# Patient Record
Sex: Male | Born: 1980 | Race: White | Hispanic: No | Smoking: Current every day smoker
Health system: Southern US, Community
[De-identification: ages and names within clinical notes are randomized; demographics above are authoritative.]

## PROBLEM LIST (undated history)

## (undated) HISTORY — PX: EYE SURGERY: SHX253

---
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
## 2001-12-18 ENCOUNTER — Emergency Department (HOSPITAL_COMMUNITY): Admission: EM | Admit: 2001-12-18 | Discharge: 2001-12-18 | Payer: Self-pay | Admitting: Emergency Medicine

## 2002-08-06 ENCOUNTER — Emergency Department (HOSPITAL_COMMUNITY): Admission: EM | Admit: 2002-08-06 | Discharge: 2002-08-06 | Payer: Self-pay | Admitting: Emergency Medicine

## 2002-09-30 ENCOUNTER — Emergency Department (HOSPITAL_COMMUNITY): Admission: EM | Admit: 2002-09-30 | Discharge: 2002-09-30 | Payer: Self-pay | Admitting: Emergency Medicine

## 2003-01-27 ENCOUNTER — Emergency Department (HOSPITAL_COMMUNITY): Admission: EM | Admit: 2003-01-27 | Discharge: 2003-01-27 | Payer: Self-pay | Admitting: Emergency Medicine

## 2005-12-18 ENCOUNTER — Emergency Department (HOSPITAL_COMMUNITY): Admission: EM | Admit: 2005-12-18 | Discharge: 2005-12-18 | Payer: Self-pay | Admitting: Emergency Medicine

## 2009-05-23 ENCOUNTER — Emergency Department (HOSPITAL_COMMUNITY): Admission: EM | Admit: 2009-05-23 | Discharge: 2009-05-23 | Payer: Self-pay | Admitting: Emergency Medicine

## 2010-07-17 LAB — COMPREHENSIVE METABOLIC PANEL
ALT: 17 U/L (ref 0–53)
AST: 19 U/L (ref 0–37)
Albumin: 4.2 g/dL (ref 3.5–5.2)
Alkaline Phosphatase: 43 U/L (ref 39–117)
BUN: 15 mg/dL (ref 6–23)
CO2: 27 mEq/L (ref 19–32)
Calcium: 8.8 mg/dL (ref 8.4–10.5)
Chloride: 104 mEq/L (ref 96–112)
Creatinine, Ser: 1.05 mg/dL (ref 0.4–1.5)
GFR calc Af Amer: >60 mL/min (ref >60)
GFR calc non Af Amer: >60 mL/min (ref >60)
Glucose, Bld: 112 mg/dL — ABNORMAL HIGH (ref 70–99)
Potassium: 4.6 mEq/L (ref 3.5–5.1)
Sodium: 137 mEq/L (ref 135–145)
Total Bilirubin: 0.4 mg/dL (ref 0.3–1.2)
Total Protein: 6.9 g/dL (ref 6.0–8.3)

## 2010-07-17 LAB — DIFFERENTIAL
Basophils Relative: 0 % (ref 0–1)
Eosinophils Absolute: 0.1 10*3/uL (ref 0.0–0.7)
Eosinophils Relative: 2 % (ref 0–5)
Lymphs Abs: 1.7 10*3/uL (ref 0.7–4.0)
Monocytes Relative: 15 % — ABNORMAL HIGH (ref 3–12)

## 2010-07-17 LAB — CBC
HCT: 48.4 % (ref 39.0–52.0)
Hemoglobin: 16.7 g/dL (ref 13.0–17.0)
MCHC: 34.6 g/dL (ref 30.0–36.0)
MCV: 92 fL (ref 78.0–100.0)
Platelets: 141 10*3/uL — ABNORMAL LOW (ref 150–400)
RBC: 5.27 MIL/uL (ref 4.22–5.81)
RDW: 13.3 % (ref 11.5–15.5)
WBC: 5.8 10*3/uL (ref 4.0–10.5)

## 2010-07-17 LAB — HEMOCCULT GUIAC POC 1CARD (OFFICE): Fecal Occult Bld: POSITIVE

## 2010-08-25 ENCOUNTER — Emergency Department (HOSPITAL_COMMUNITY)
Admission: EM | Admit: 2010-08-25 | Discharge: 2010-08-26 | Disposition: A | Discharge status: Home / Self Care | Payer: Self-pay | Attending: Emergency Medicine | Admitting: Emergency Medicine

## 2010-08-25 ENCOUNTER — Emergency Department (HOSPITAL_COMMUNITY): Payer: Self-pay

## 2010-08-25 DIAGNOSIS — Y92009 Unspecified place in unspecified non-institutional (private) residence as the place of occurrence of the external cause: Secondary | ICD-10-CM | POA: Insufficient documentation

## 2010-08-25 DIAGNOSIS — IMO0002 Reserved for concepts with insufficient information to code with codable children: Secondary | ICD-10-CM | POA: Insufficient documentation

## 2010-08-25 DIAGNOSIS — S63509A Unspecified sprain of unspecified wrist, initial encounter: Secondary | ICD-10-CM | POA: Insufficient documentation

## 2010-08-25 DIAGNOSIS — M79609 Pain in unspecified limb: Secondary | ICD-10-CM | POA: Insufficient documentation

## 2010-08-25 DIAGNOSIS — F17200 Nicotine dependence, unspecified, uncomplicated: Secondary | ICD-10-CM | POA: Insufficient documentation

## 2010-08-25 DIAGNOSIS — W010XXA Fall on same level from slipping, tripping and stumbling without subsequent striking against object, initial encounter: Secondary | ICD-10-CM | POA: Insufficient documentation

## 2010-08-25 DIAGNOSIS — M25539 Pain in unspecified wrist: Secondary | ICD-10-CM | POA: Insufficient documentation

## 2012-08-18 IMAGING — CR DG WRIST COMPLETE 3+V*R*
3 series · 3 of 3 positions shown · non-contrast
Comparison: None.

CLINICAL DATA: Fell and injured right wrist.

RIGHT WRIST - COMPLETE 3+ VIEW 08/25/2010:

[x wrist pa right]
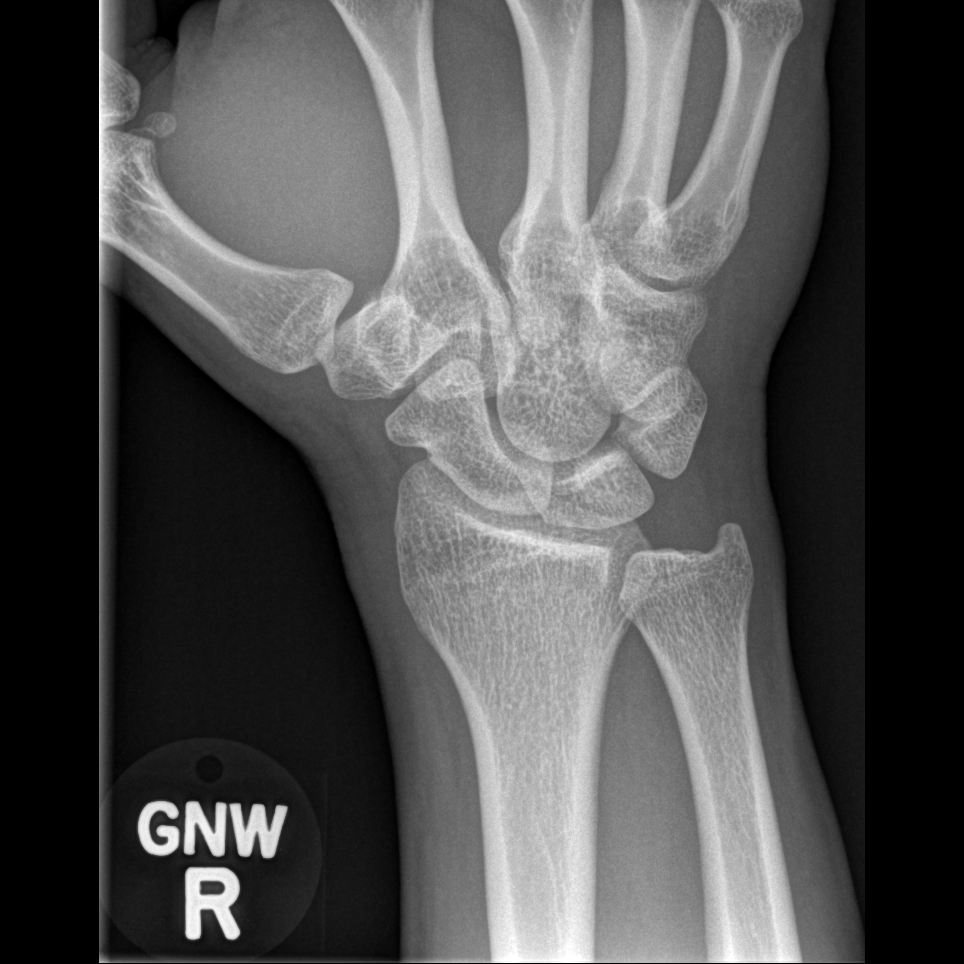

[x wrist obl right]
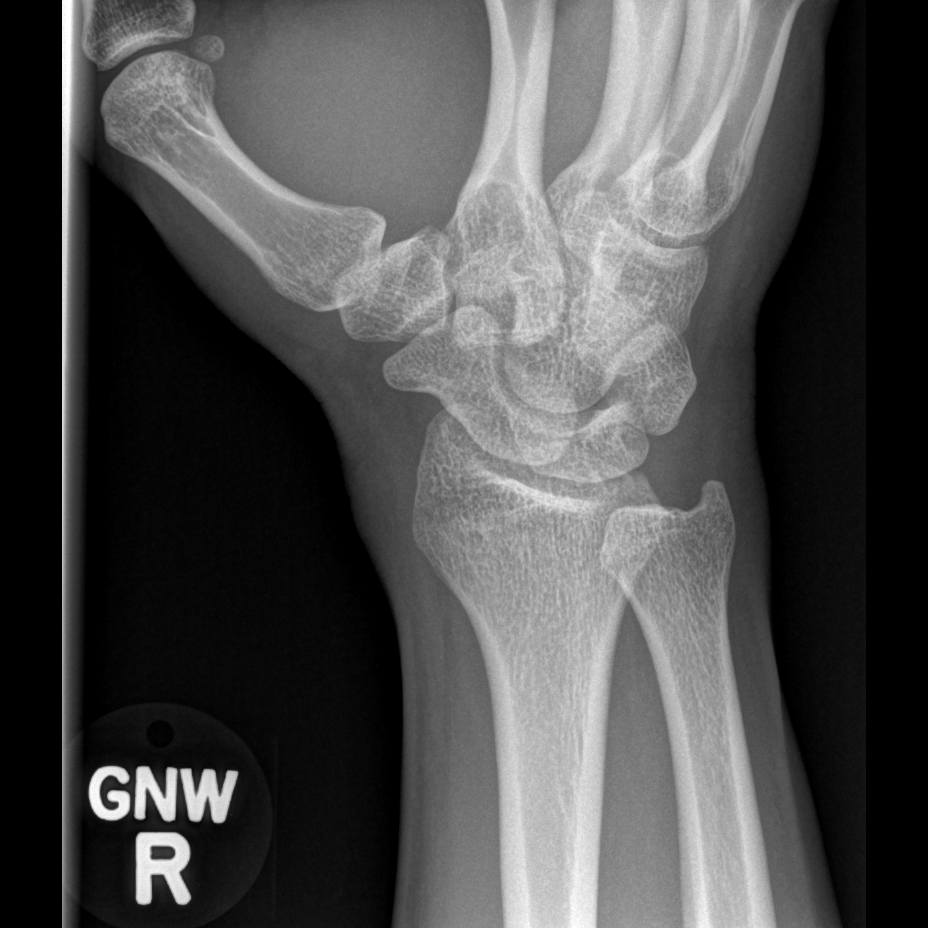

[x wrist lat right]
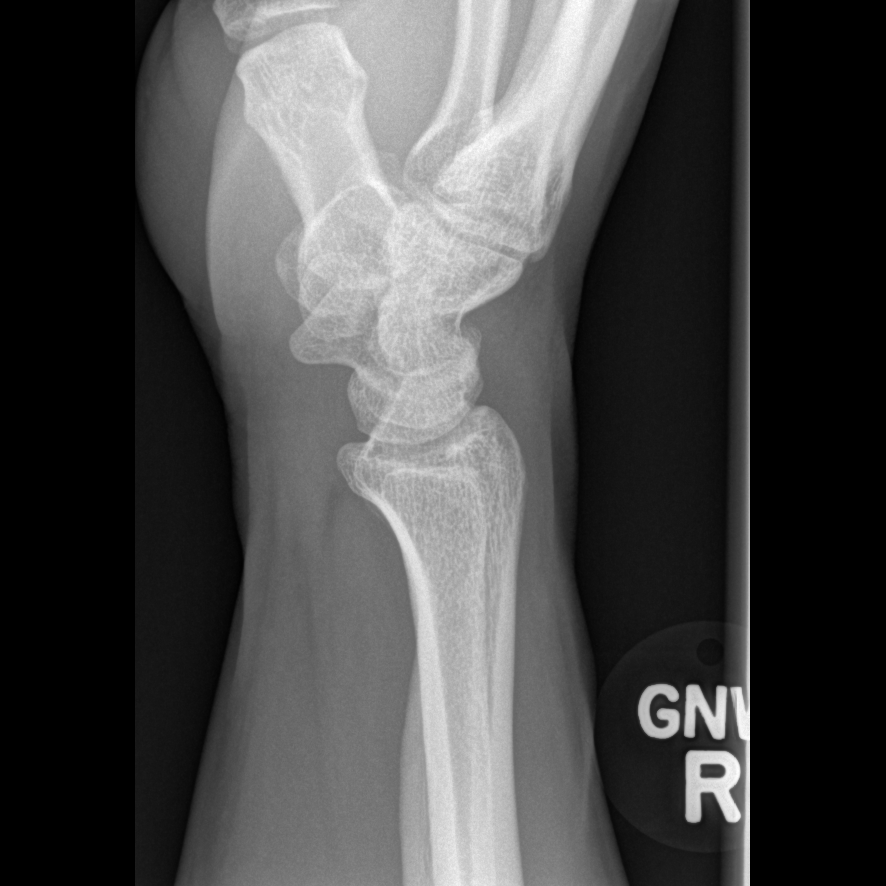

[3 of 3 positions shown; findings below may reference images not displayed]

FINDINGS: Soft tissue swelling. No evidence of acute or subacute
fracture or dislocation.  Well-preserved joint spaces.  Well-
preserved bone mineral density.  No intrinsic osseous
abnormalities.
IMPRESSION: No osseous abnormality.

## 2013-05-06 ENCOUNTER — Emergency Department (HOSPITAL_COMMUNITY)
Admission: EM | Admit: 2013-05-06 | Discharge: 2013-05-06 | Disposition: A | Discharge status: Home / Self Care | Payer: Self-pay | Attending: Emergency Medicine | Admitting: Emergency Medicine

## 2013-05-06 ENCOUNTER — Encounter (HOSPITAL_COMMUNITY): Payer: Self-pay | Admitting: Emergency Medicine

## 2013-05-06 ENCOUNTER — Emergency Department (HOSPITAL_COMMUNITY): Payer: Self-pay

## 2013-05-06 DIAGNOSIS — IMO0002 Reserved for concepts with insufficient information to code with codable children: Secondary | ICD-10-CM | POA: Insufficient documentation

## 2013-05-06 DIAGNOSIS — Y929 Unspecified place or not applicable: Secondary | ICD-10-CM | POA: Insufficient documentation

## 2013-05-06 DIAGNOSIS — Y9389 Activity, other specified: Secondary | ICD-10-CM | POA: Insufficient documentation

## 2013-05-06 DIAGNOSIS — F17200 Nicotine dependence, unspecified, uncomplicated: Secondary | ICD-10-CM | POA: Insufficient documentation

## 2013-05-06 DIAGNOSIS — W19XXXA Unspecified fall, initial encounter: Secondary | ICD-10-CM

## 2013-05-06 DIAGNOSIS — M549 Dorsalgia, unspecified: Secondary | ICD-10-CM

## 2013-05-06 DIAGNOSIS — W010XXA Fall on same level from slipping, tripping and stumbling without subsequent striking against object, initial encounter: Secondary | ICD-10-CM | POA: Insufficient documentation

## 2013-05-06 MED ORDER — OXYCODONE-ACETAMINOPHEN 5-325 MG PO TABS
2.0000 | ORAL_TABLET | Freq: Once | ORAL | Status: AC
  Administered 2013-05-06: 2 via ORAL
  Filled 2013-05-06: qty 2

## 2013-05-06 MED ORDER — ONDANSETRON 4 MG PO TBDP
8.0000 mg | ORAL_TABLET | Freq: Once | ORAL | Status: AC
  Administered 2013-05-06: 8 mg via ORAL
  Filled 2013-05-06: qty 2

## 2013-05-06 MED ORDER — PROMETHAZINE HCL 25 MG PO TABS: 25.0000 mg | ORAL_TABLET | Freq: Four times a day (QID) | ORAL | Status: DC | PRN

## 2013-05-06 MED ORDER — OXYCODONE-ACETAMINOPHEN 5-325 MG PO TABS
1.0000 | ORAL_TABLET | Freq: Four times a day (QID) | ORAL | Status: DC | PRN
Start: 2013-05-06 — End: 2013-07-08

## 2013-05-06 NOTE — ED Notes (Signed)
Pt c/o lower back pain after tripping and falling while carrying box today

## 2013-05-06 NOTE — ED Provider Notes (Signed)
CSN: [id number]     Arrival date & time 05/06/13  1114 History  This chart was scribed for non-physician practitioner, Junious SilkHannah Aries Kasa, PA-C working with Candyce ChurnJohn David Wofford, MD by Greggory StallionKayla Andersen, ED scribe. This patient was seen in room TR11C/TR11C and the patient's care was started at 1:07 PM.   Chief Complaint  Patient presents with  . Fall  . Back Pain   The history is provided by the patient. No language interpreter was used.   HPI Comments: Bobby May is a 33 y.o. male who presents to the Emergency Department complaining of a fall that occurred around 7:30 AM today. Pt was carrying a box, tripped, fell and landed on his back. He has sudden onset lower back pain that radiates up his back. Pt has taken extra strength tylenol with no relief. Movement worsens the pain. Denies ankle pain, bowel or bladder incontinence. Denies history of drug use or cancer.   History reviewed. No pertinent past medical history. History reviewed. No pertinent past surgical history. History reviewed. No pertinent family history. History  Substance Use Topics  . Smoking status: Current Every Day Smoker  . Smokeless tobacco: Not on file  . Alcohol Use: Yes     Comment: occ    Review of Systems  Genitourinary:       Negative for bowel or bladder incontinence.   Musculoskeletal: Positive for back pain and myalgias. Negative for arthralgias.  All other systems reviewed and are negative.    Allergies  Review of patient's allergies indicates no known allergies.  Home Medications  No current outpatient prescriptions on file.  BP 131/71  Pulse 80  Temp(Src) 97.9 F (36.6 C) (Oral)  Resp 18  Wt 169 lb 12.8 oz (77.021 kg)  SpO2 99%  Physical Exam  Nursing note and vitals reviewed. Constitutional: He is oriented to person, place, and time. He appears well-developed and well-nourished. No distress.  HENT:  Head: Normocephalic and atraumatic.  Right Ear: External ear normal.  Left Ear:  External ear normal.  Nose: Nose normal.  Eyes: Conjunctivae are normal.  Neck: Normal range of motion. No tracheal deviation present.  Cardiovascular: Normal rate, regular rhythm and normal heart sounds.   Pulmonary/Chest: Effort normal and breath sounds normal. No stridor.  Abdominal: Soft. He exhibits no distension. There is no tenderness.  Musculoskeletal: Normal range of motion.       Back:  Tender to palpation over sacrum and lumbar spine.   Neurological: He is alert and oriented to person, place, and time.  Skin: Skin is warm and dry. He is not diaphoretic.  Psychiatric: He has a normal mood and affect. His behavior is normal.    ED Course  Procedures (including critical care time)  DIAGNOSTIC STUDIES: Oxygen Saturation is 99% on RA, normal by my interpretation.    COORDINATION OF CARE: 1:09 PM-Discussed treatment plan which includes pain medication and xray with pt at bedside and pt agreed to plan.   Labs Review Labs Reviewed - No data to display Imaging Review Dg Pelvis 1-2 Views  05/06/2013   CLINICAL DATA:  Larey SeatFell this morning.  Pelvic pain.  EXAM: PELVIS - 1-2 VIEW  COMPARISON:  None.  FINDINGS: There is no evidence of pelvic fracture or diastasis. No other pelvic bone lesions are seen.  IMPRESSION: Negative.   Electronically Signed   By: Elige KoHetal  Patel   On: 05/06/2013 13:56    EKG Interpretation   None       MDM  1. Fall, initial encounter   2. Back pain    Patient with back pain.  No neurological deficits and normal neuro exam.  Patient can walk but states is painful.  No loss of bowel or bladder control.  No concern for cauda equina.  No fever, night sweats, weight loss, h/o cancer, IVDU.  RICE protocol and pain medicine indicated and discussed with patient.   I personally performed the services described in this documentation, which was scribed in my presence. The recorded information has been reviewed and is accurate.    Mora Bellman, PA-C 05/06/13  2134

## 2013-05-06 NOTE — Discharge Instructions (Signed)
Back Pain, Adult °Back pain is very common. The pain often gets better over time. The cause of back pain is usually not dangerous. Most people can learn to manage their back pain on their own.  °HOME CARE  °· Stay active. Start with short walks on flat ground if you can. Try to walk farther each day. °· Do not sit, drive, or stand in one place for more than 30 minutes. Do not stay in bed. °· Do not avoid exercise or work. Activity can help your back heal faster. °· Be careful when you bend or lift an object. Bend at your knees, keep the object close to you, and do not twist. °· Sleep on a firm mattress. Lie on your side, and bend your knees. If you lie on your back, put a pillow under your knees. °· Only take medicines as told by your doctor. °· Put ice on the injured area. °· Put ice in a plastic bag. °· Place a towel between your skin and the bag. °· Leave the ice on for 15-20 minutes, 03-04 times a day for the first 2 to 3 days. After that, you can switch between ice and heat packs. °· Ask your doctor about back exercises or massage. °· Avoid feeling anxious or stressed. Find good ways to deal with stress, such as exercise. °GET HELP RIGHT AWAY IF:  °· Your pain does not go away with rest or medicine. °· Your pain does not go away in 1 week. °· You have new problems. °· You do not feel well. °· The pain spreads into your legs. °· You cannot control when you poop (bowel movement) or pee (urinate). °· Your arms or legs feel weak or lose feeling (numbness). °· You feel sick to your stomach (nauseous) or throw up (vomit). °· You have belly (abdominal) pain. °· You feel like you may pass out (faint). °MAKE SURE YOU:  °· Understand these instructions. °· Will watch your condition. °· Will get help right away if you are not doing well or get worse. °Document Released: 10/03/2007 Document Revised: 07/09/2011 Document Reviewed: 09/04/2010 °ExitCare® Patient Information ©2014 [employer]. ° °Back Exercises °Back  exercises help treat and prevent back injuries. The goal is to increase your strength in your belly (abdominal) and back muscles. These exercises can also help with flexibility. Start these exercises when told by your doctor. °HOME CARE °Back exercises include: °Pelvic Tilt. °· Lie on your back with your knees bent. Tilt your pelvis until the lower part of your back is against the floor. Hold this position 5 to 10 sec. Repeat this exercise 5 to 10 times. °Knee to Chest. °· Pull 1 knee up against your chest and hold for 20 to 30 seconds. Repeat this with the other knee. This may be done with the other leg straight or bent, whichever feels better. Then, pull both knees up against your chest. °Sit-Ups or Curl-Ups. °· Bend your knees 90 degrees. Start with tilting your pelvis, and do a partial, slow sit-up. Only lift your upper half 30 to 45 degrees off the floor. Take at least 2 to 3 seonds for each sit-up. Do not do sit-ups with your knees out straight. If partial sit-ups are difficult, simply do the above but with only tightening your belly (abdominal) muscles and holding it as told. °Hip-Lift. °· Lie on your back with your knees flexed 90 degrees. Push down with your feet and shoulders as you raise your hips 2 inches off the   floor. Hold for 10 seconds, repeat 5 to 10 times. °Back Arches. °· Lie on your stomach. Prop yourself up on bent elbows. Slowly press on your hands, causing an arch in your low back. Repeat 3 to 5 times. °Shoulder-Lifts. °· Lie face down with arms beside your body. Keep hips and belly pressed to floor as you slowly lift your head and shoulders off the floor. °Do not overdo your exercises. Be careful in the beginning. Exercises may cause you some mild back discomfort. If the pain lasts for more than 15 minutes, stop the exercises until you see your doctor. Improvement with exercise for back problems is slow.  °Document Released: 05/19/2010 Document Revised: 07/09/2011 Document Reviewed:  02/15/2011 °ExitCare® Patient Information ©2014 [employer]. ° °

## 2013-05-07 NOTE — ED Provider Notes (Signed)
Medical screening examination/treatment/procedure(s) were performed by non-physician practitioner and as supervising physician I was immediately available for consultation/collaboration.  EKG Interpretation   None         Bobby ChurnJohn David Wylie Coon, MD 05/07/13 1452

## 2013-07-08 ENCOUNTER — Emergency Department (HOSPITAL_COMMUNITY)
Admission: EM | Admit: 2013-07-08 | Discharge: 2013-07-08 | Disposition: A | Discharge status: Home / Self Care | Payer: Self-pay | Attending: Emergency Medicine | Admitting: Emergency Medicine

## 2013-07-08 ENCOUNTER — Encounter (HOSPITAL_COMMUNITY): Payer: Self-pay | Admitting: Emergency Medicine

## 2013-07-08 DIAGNOSIS — Y9389 Activity, other specified: Secondary | ICD-10-CM | POA: Insufficient documentation

## 2013-07-08 DIAGNOSIS — S335XXA Sprain of ligaments of lumbar spine, initial encounter: Principal | ICD-10-CM

## 2013-07-08 DIAGNOSIS — Y990 Civilian activity done for income or pay: Secondary | ICD-10-CM | POA: Insufficient documentation

## 2013-07-08 DIAGNOSIS — X500XXA Overexertion from strenuous movement or load, initial encounter: Secondary | ICD-10-CM | POA: Insufficient documentation

## 2013-07-08 DIAGNOSIS — S339XXA Sprain of unspecified parts of lumbar spine and pelvis, initial encounter: Secondary | ICD-10-CM | POA: Insufficient documentation

## 2013-07-08 DIAGNOSIS — F17200 Nicotine dependence, unspecified, uncomplicated: Secondary | ICD-10-CM | POA: Insufficient documentation

## 2013-07-08 DIAGNOSIS — S39012A Strain of muscle, fascia and tendon of lower back, initial encounter: Secondary | ICD-10-CM

## 2013-07-08 DIAGNOSIS — Y929 Unspecified place or not applicable: Secondary | ICD-10-CM | POA: Insufficient documentation

## 2013-07-08 MED ORDER — PREDNISONE 20 MG PO TABS
60.0000 mg | ORAL_TABLET | Freq: Once | ORAL | Status: AC
  Administered 2013-07-08: 60 mg via ORAL
  Filled 2013-07-08: qty 3

## 2013-07-08 MED ORDER — HYDROCODONE-ACETAMINOPHEN 5-325 MG PO TABS
1.0000 | ORAL_TABLET | Freq: Once | ORAL | Status: AC
  Administered 2013-07-08: 1 via ORAL
  Filled 2013-07-08: qty 1

## 2013-07-08 MED ORDER — CYCLOBENZAPRINE HCL 10 MG PO TABS
5.0000 mg | ORAL_TABLET | Freq: Once | ORAL | Status: AC
  Administered 2013-07-08: 5 mg via ORAL
  Filled 2013-07-08: qty 1

## 2013-07-08 MED ORDER — CYCLOBENZAPRINE HCL 5 MG PO TABS: 5.0000 mg | ORAL_TABLET | Freq: Three times a day (TID) | ORAL | Status: AC | PRN

## 2013-07-08 MED ORDER — PREDNISONE 20 MG PO TABS: ORAL_TABLET | ORAL | Status: AC

## 2013-07-08 MED ORDER — HYDROCODONE-ACETAMINOPHEN 5-325 MG PO TABS: 1.0000 | ORAL_TABLET | Freq: Four times a day (QID) | ORAL | Status: AC | PRN

## 2013-07-08 NOTE — ED Notes (Signed)
Pt states that he previously injured his back but today he bent down to pick something up and heard a pop and now has low back pain. Alert and oriented. Ambulatory to triage.

## 2013-07-08 NOTE — Discharge Instructions (Signed)
Back Pain, Adult Low back pain is very common. About 1 in 5 people have back pain.The cause of low back pain is rarely dangerous. The pain often gets better over time.About half of people with a sudden onset of back pain feel better in just 2 weeks. About 8 in 10 people feel better by 6 weeks.  CAUSES Some common causes of back pain include:  Strain of the muscles or ligaments supporting the spine.  Wear and tear (degeneration) of the spinal discs.  Arthritis.  Direct injury to the back. DIAGNOSIS Most of the time, the direct cause of low back pain is not known.However, back pain can be treated effectively even when the exact cause of the pain is unknown.Answering your caregiver's questions about your overall health and symptoms is one of the most accurate ways to make sure the cause of your pain is not dangerous. If your caregiver needs more information, he or she may order lab work or imaging tests (X-rays or MRIs).However, even if imaging tests show changes in your back, this usually does not require surgery. HOME CARE INSTRUCTIONS For many people, back pain returns.Since low back pain is rarely dangerous, it is often a condition that people can learn to [facility] their own.   Remain active. It is stressful on the back to sit or stand in one place. Do not sit, drive, or stand in one place for more than 30 minutes at a time. Take short walks on level surfaces as soon as pain allows.Try to increase the length of time you walk each day.  Do not stay in bed.Resting more than 1 or 2 days can delay your recovery.  Do not avoid exercise or work.Your body is made to move.It is not dangerous to be active, even though your back may hurt.Your back will likely heal faster if you return to being active before your pain is gone.  Pay attention to your body when you bend and lift. Many people have less discomfortwhen lifting if they bend their knees, keep the load close to their bodies,and  avoid twisting. Often, the most comfortable positions are those that put less stress on your recovering back.  Find a comfortable position to sleep. Use a firm mattress and lie on your side with your knees slightly bent. If you lie on your back, put a pillow under your knees.  Only take over-the-counter or prescription medicines as directed by your caregiver. Over-the-counter medicines to reduce pain and inflammation are often the most helpful.Your caregiver may prescribe muscle relaxant drugs.These medicines help dull your pain so you can more quickly return to your normal activities and healthy exercise.  Put ice on the injured area.  Put ice in a plastic bag.  Place a towel between your skin and the bag.  Leave the ice on for 15-20 minutes, 03-04 times a day for the first 2 to 3 days. After that, ice and heat may be alternated to reduce pain and spasms.  Ask your caregiver about trying back exercises and gentle massage. This may be of some benefit.  Avoid feeling anxious or stressed.Stress increases muscle tension and can worsen back pain.It is important to recognize when you are anxious or stressed and learn ways to manage it.Exercise is a great option. SEEK MEDICAL CARE IF:  You have pain that is not relieved with rest or medicine.  You have pain that does not improve in 1 week.  You have new symptoms.  You are generally not feeling well. SEEK  IMMEDIATE MEDICAL CARE IF:   You have pain that radiates from your back into your legs.  You develop new bowel or bladder control problems.  You have unusual weakness or numbness in your arms or legs.  You develop nausea or vomiting.  You develop abdominal pain.  You feel faint. Document Released: 04/16/2005 Document Revised: 10/16/2011 Document Reviewed: 09/04/2010 [facility] Patient Information 2014 [place] [place].  Cryotherapy Cryotherapy means treatment with cold. Ice or gel packs can be used to reduce both pain and  swelling. Ice is the most helpful within the first 24 to 48 hours after an injury or flareup from overusing a muscle or joint. Sprains, strains, spasms, burning pain, shooting pain, and aches can all be eased with ice. Ice can also be used when recovering from surgery. Ice is effective, has very few side effects, and is safe for most people to use. PRECAUTIONS  Ice is not a safe treatment option for people with:  Raynaud's phenomenon. This is a condition affecting small blood vessels in the extremities. Exposure to cold may cause your problems to return.  Cold hypersensitivity. There are many forms of cold hypersensitivity, including:  Cold urticaria. Red, itchy hives appear on the skin when the tissues begin to warm after being iced.  Cold erythema. This is a red, itchy rash caused by exposure to cold.  Cold hemoglobinuria. Red blood cells break down when the tissues begin to warm after being iced. The hemoglobin that carry oxygen are passed into the urine because they cannot combine with blood proteins fast enough.  Numbness or altered sensitivity in the area being iced. If you have any of the following conditions, do not use ice until you have discussed cryotherapy with your caregiver:  Heart conditions, such as arrhythmia, angina, or chronic heart disease.  High blood pressure.  Healing wounds or open skin in the area being iced.  Current infections.  Rheumatoid arthritis.  Poor circulation.  Diabetes. Ice slows the blood flow in the region it is applied. This is beneficial when trying to stop inflamed tissues from spreading irritating chemicals to surrounding tissues. However, if you expose your skin to cold temperatures for too long or without the proper protection, you can damage your skin or nerves. Watch for signs of skin damage due to cold. HOME CARE INSTRUCTIONS Follow these tips to use ice and cold packs safely.  Place a dry or damp towel between the ice and skin. A damp  towel will cool the skin more quickly, so you may need to shorten the time that the ice is used.  For a more rapid response, add gentle compression to the ice.  Ice for no more than 10 to 20 minutes at a time. The bonier the area you are icing, the less time it will take to get the benefits of ice.  Check your skin after 5 minutes to make sure there are no signs of a poor response to cold or skin damage.  Rest 20 minutes or more in between uses.  Once your skin is numb, you can end your treatment. You can test numbness by very lightly touching your skin. The touch should be so light that you do not see the skin dimple from the pressure of your fingertip. When using ice, most people will feel these normal sensations in this order: cold, burning, aching, and numbness.  Do not use ice on someone who cannot communicate their responses to pain, such as small children or people with dementia. HOW  MAKE AN ICE PACK °Ice packs are the most common way to use ice therapy. Other methods include ice massage, ice baths, and cryo-sprays. Muscle creams that cause a cold, tingly feeling do not offer the same benefits that ice offers and should not be used as a substitute unless recommended by your caregiver. °To make an ice pack, do one of the following: °· Place crushed ice or a bag of frozen vegetables in a sealable plastic bag. Squeeze out the excess air. Place this bag inside another plastic bag. Slide the bag into a pillowcase or place a damp towel between your skin and the bag. °· Mix 3 parts water with 1 part rubbing alcohol. Freeze the mixture in a sealable plastic bag. When you remove the mixture from the freezer, it will be slushy. Squeeze out the excess air. Place this bag inside another plastic bag. Slide the bag into a pillowcase or place a damp towel between your skin and the bag. °SEEK MEDICAL CARE IF: °· You develop white spots on your skin. This may give the skin a blotchy (mottled)  appearance. °· Your skin turns blue or pale. °· Your skin becomes waxy or hard. °· Your swelling gets worse. °MAKE SURE YOU:  °· Understand these instructions. °· Will watch your condition. °· Will get help right away if you are not doing well or get worse. °Document Released: 12/11/2010 Document Revised: 07/09/2011 Document Reviewed: 12/11/2010 °ExitCare® Patient Information ©2014 [employer]. ° °

## 2013-07-08 NOTE — ED Provider Notes (Signed)
CSN: [id number]     Arrival date & time 07/08/13  2026 History  This chart was scribed for Earley Favor, NP, working with Gwyneth Sprout, MD, by Ellin Mayhew, ED Scribe. This patient was seen in room WTR9/WTR9 and the patient's care was started at 10:10 PM.   The history is provided by the patient. No language interpreter was used.   HPI Comments: Bobby May is a 33 y.o. male who presents to the Emergency Department with a chief complaint of R low back pain with onset today. Patient states he has injured the same area on his back 1.5 months ago which resolved for a few weeks; however has returned when he heard "pop" when he bent down to lift wooden objects at work. Patient states his pain is a constant non changing ache/soreness that has limited his movement. Additionally, he states his pain is made worse with movement and change of position. Patient states he has f/u with Dr. Garald Balding following the initial incident that indicated negative x-ray findings; however has no had an MRI exam following the initial incident. PCP is Dr. Manson Passey at [facility].   History reviewed. No pertinent past medical history. Past Surgical History  Procedure Laterality Date  . Eye surgery     History reviewed. No pertinent family history. History  Substance Use Topics  . Smoking status: Current Every Day Smoker  . Smokeless tobacco: Not on file  . Alcohol Use: Yes     Comment: occ    Review of Systems  Constitutional: Negative for fever and chills.  Respiratory: Negative for shortness of breath.   Gastrointestinal: Negative for nausea and vomiting.  Musculoskeletal: Positive for back pain.  Neurological: Negative for weakness.  All other systems reviewed and are negative.   Allergies  Bee venom  Home Medications   Current Outpatient Rx  Name  Route  Sig  Dispense  Refill  . acetaminophen (TYLENOL) 500 MG tablet   Oral   Take 1,000 mg by mouth every 6 (six) hours as needed for  moderate pain.         . cyclobenzaprine (FLEXERIL) 5 MG tablet   Oral   Take 1 tablet (5 mg total) by mouth 3 (three) times daily as needed for muscle spasms.   30 tablet   0   . HYDROcodone-acetaminophen (NORCO/VICODIN) 5-325 MG per tablet   Oral   Take 1 tablet by mouth every 6 (six) hours as needed for moderate pain.   12 tablet   0   . predniSONE (DELTASONE) 20 MG tablet      3 Tabs PO Days 1-3, then 2 tabs PO Days 4-6, then 1 tab PO Day 7-9, then Half Tab PO Day 10-12   20 tablet   0    Triage Vitals: BP 120/70  Pulse 79  Temp(Src) 98.1 F (36.7 C) (Oral)  SpO2 99%  Physical Exam  Nursing note and vitals reviewed. Constitutional: He is oriented to person, place, and time. He appears well-developed and well-nourished. No distress.  HENT:  Head: Normocephalic and atraumatic.  Eyes: EOM are normal.  Neck: Neck supple. No tracheal deviation present.  Cardiovascular: Normal rate.   Pulmonary/Chest: Effort normal. No respiratory distress.  Musculoskeletal: He exhibits tenderness.  No spinal tenderness. R flank tenderness with minimal swelling.  Neurological: He is alert and oriented to person, place, and time.  Skin: Skin is warm and dry.  Psychiatric: He has a normal mood and affect. His behavior is normal.  ED Course  Procedures (including critical care time)  DIAGNOSTIC STUDIES: Oxygen Saturation is 99% on room air, normal by my interpretation.    COORDINATION OF CARE: 10:11 PM-Recommended an MRI to image soft-tissue in the area and potential herniated disc.Treatment plan discussed with patient and patient agrees.  Labs Review Labs Reviewed - No data to display Imaging Review No results found.   EKG Interpretation None      MDM   Final diagnoses:  Lumbosacral strain      I personally performed the services described in this documentation, which was scribed in my presence. The recorded information has been reviewed and is accurate.   Arman FilterGail  K Zaharah Amir, NP 07/09/13 [phone]

## 2013-07-09 ENCOUNTER — Ambulatory Visit (HOSPITAL_COMMUNITY)
Admission: RE | Admit: 2013-07-09 | Discharge: 2013-07-09 | Disposition: A | Discharge status: Home / Self Care | Payer: Self-pay | Source: Ambulatory Visit | Attending: Emergency Medicine | Admitting: Emergency Medicine

## 2013-07-09 NOTE — Progress Notes (Signed)
MRI called and left message for patientat  8am to make aware of MRI appointment at 1pm. Patient was a no show by 1:30pm  I cancelled exam. bhj

## 2013-07-09 NOTE — Progress Notes (Signed)
[facility] [place] [facility]She reports patient has questions regarding his pain medication prescriptions.CM placed call to patient.Demographics verified  In EPIC.Patient asked CM if she could provide any medication assist for his pain medication.CM educated patient she was unable to provide medication assit with a federal program But provided education on [facility]  / [facility]. Patient reports he has funds for his pain medications today but as he has no Insurance / PCP he was looking at his options. CM educated patient on importance of establishing PCP services and looking at [facility] options.Patient reports he has applied medicaid patient reports he will follow up.Patient  Receptive to CM help and had no further questions.

## 2013-07-09 NOTE — ED Provider Notes (Signed)
Medical screening examination/treatment/procedure(s) were performed by non-physician practitioner and as supervising physician I was immediately available for consultation/collaboration.   EKG Interpretation None        Lauria Depoy, MD 07/09/13 2356 

## 2015-04-30 IMAGING — CR DG PELVIS 1-2V
2 series · 2 of 2 positions shown · non-contrast
Comparison: None.

CLINICAL DATA: Fell this morning.  Pelvic pain.

EXAM:
PELVIS - 1-2 VIEW

[t pelvis a.p. (1 of 2)]
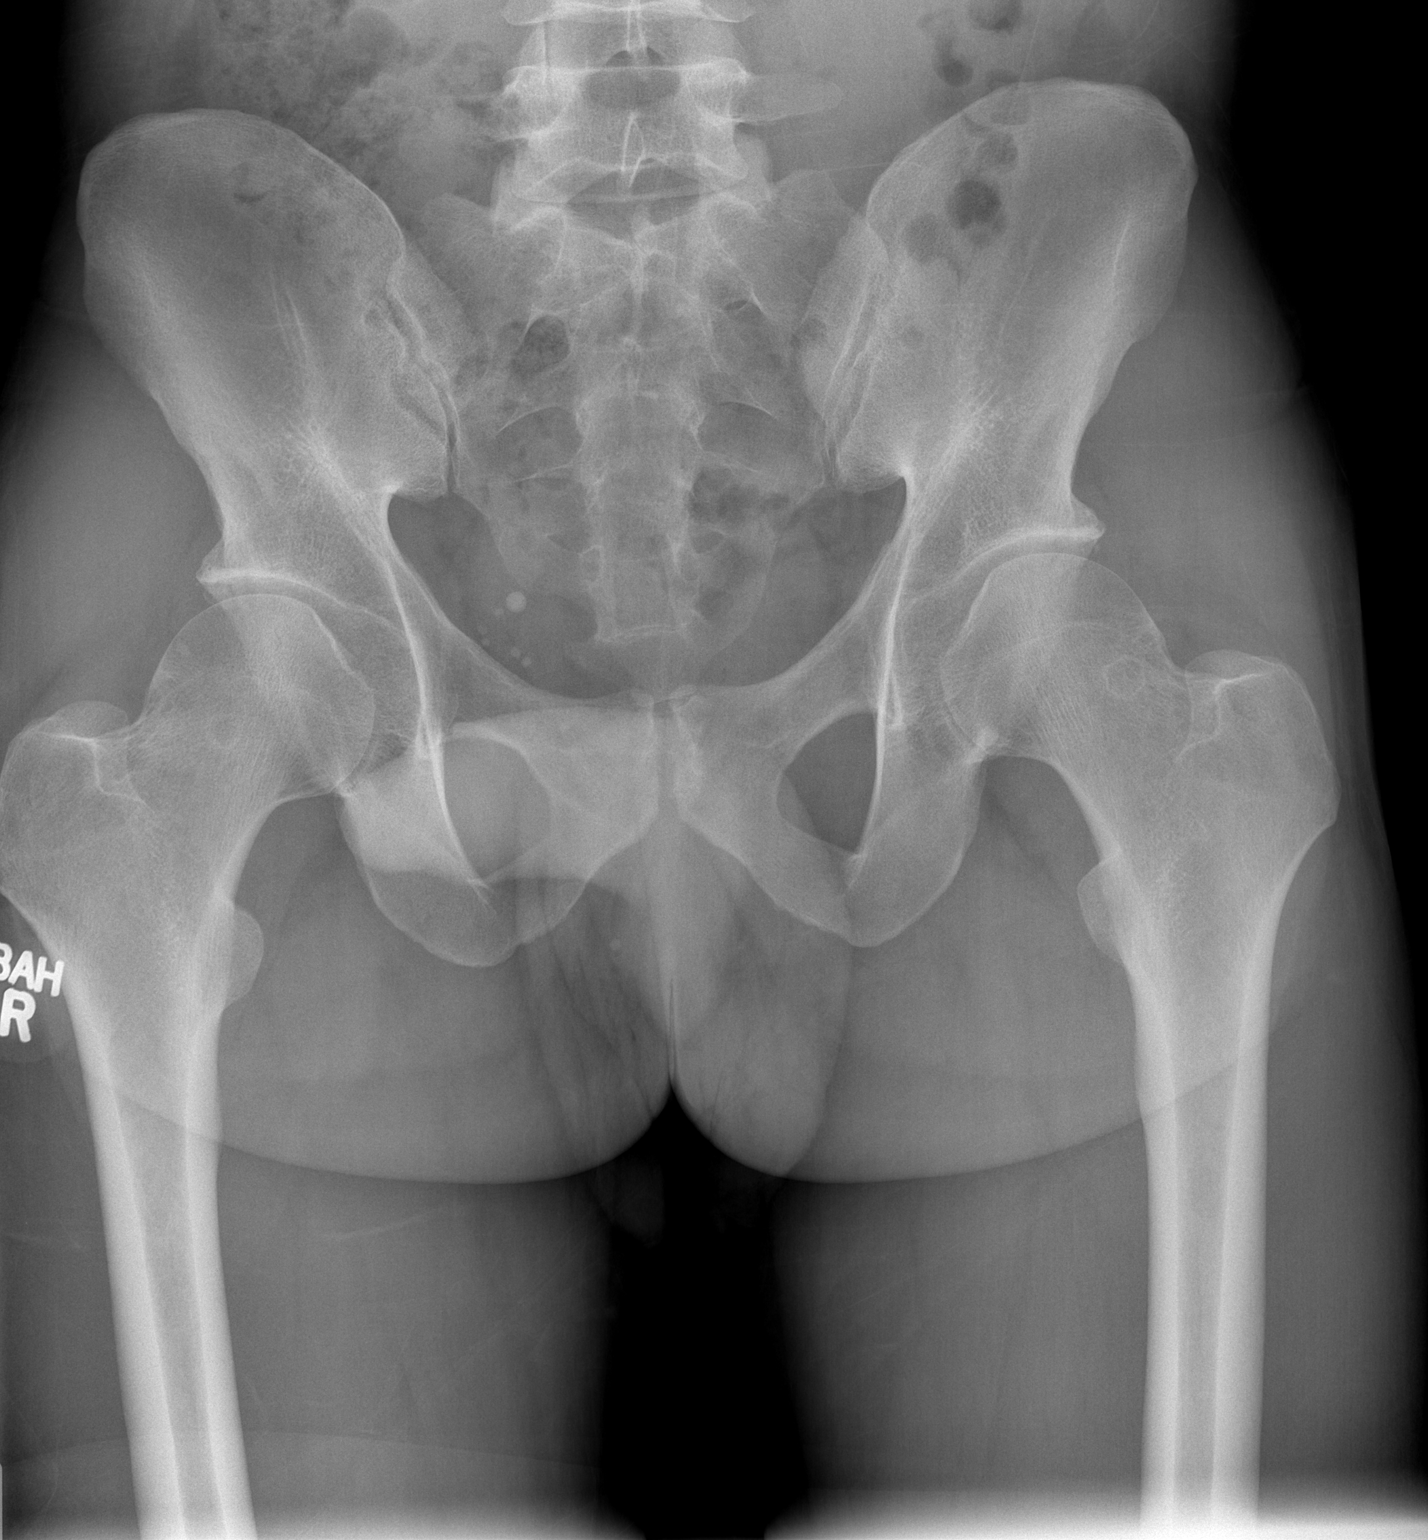

[t pelvis a.p. (2 of 2)]
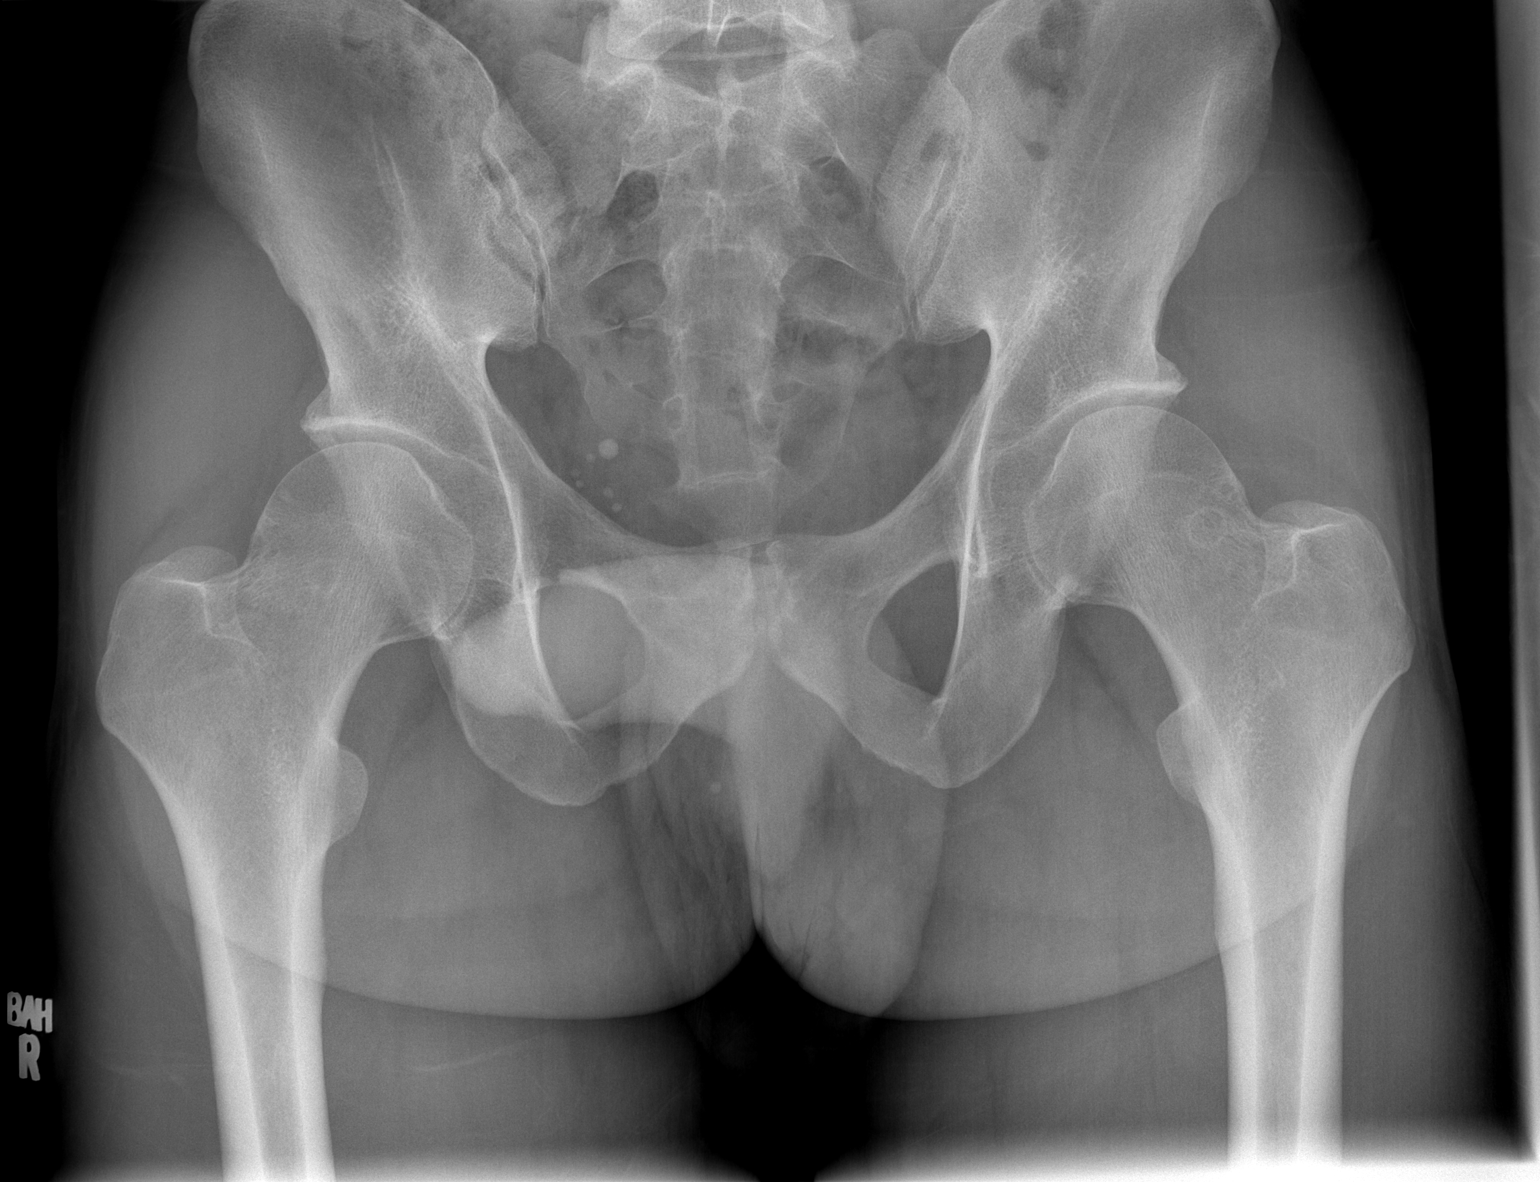

[2 of 2 positions shown; findings below may reference images not displayed]

FINDINGS: There is no evidence of pelvic fracture or diastasis. No other
pelvic bone lesions are seen.
IMPRESSION: Negative.
